# Patient Record
Sex: Female | Born: 1970 | Hispanic: No | Marital: Married | Smoking: Never smoker
Health system: Southern US, Community
[De-identification: ages and names within clinical notes are randomized; demographics above are authoritative.]

## PROBLEM LIST (undated history)

## (undated) ENCOUNTER — Emergency Department: Admission: EM | Payer: BC Managed Care – PPO

## (undated) DIAGNOSIS — E079 Disorder of thyroid, unspecified: Secondary | ICD-10-CM

## (undated) HISTORY — DX: Disorder of thyroid, unspecified: E07.9

---

## 2005-05-24 ENCOUNTER — Other Ambulatory Visit: Admission: RE | Admit: 2005-05-24 | Discharge: 2005-05-24 | Payer: Self-pay | Admitting: Gynecology

## 2006-05-02 ENCOUNTER — Encounter: Admission: RE | Admit: 2006-05-02 | Discharge: 2006-05-02 | Payer: Self-pay | Admitting: Family Medicine

## 2006-07-03 ENCOUNTER — Other Ambulatory Visit: Admission: RE | Admit: 2006-07-03 | Discharge: 2006-07-03 | Payer: Self-pay | Admitting: Gynecology

## 2009-10-08 ENCOUNTER — Encounter: Admission: RE | Admit: 2009-10-08 | Discharge: 2009-10-08 | Payer: Self-pay | Admitting: Gynecology

## 2010-08-03 ENCOUNTER — Other Ambulatory Visit: Payer: Self-pay | Admitting: Gynecology

## 2010-08-03 DIAGNOSIS — Z1231 Encounter for screening mammogram for malignant neoplasm of breast: Secondary | ICD-10-CM

## 2010-10-25 ENCOUNTER — Ambulatory Visit
Admission: RE | Admit: 2010-10-25 | Discharge: 2010-10-25 | Disposition: A | Discharge status: Home / Self Care | Payer: BC Managed Care – PPO | Source: Ambulatory Visit | Attending: Gynecology | Admitting: Gynecology

## 2010-10-25 DIAGNOSIS — Z1231 Encounter for screening mammogram for malignant neoplasm of breast: Secondary | ICD-10-CM

## 2010-10-27 ENCOUNTER — Other Ambulatory Visit: Payer: Self-pay | Admitting: Gynecology

## 2010-10-27 DIAGNOSIS — R928 Other abnormal and inconclusive findings on diagnostic imaging of breast: Secondary | ICD-10-CM

## 2010-11-02 ENCOUNTER — Ambulatory Visit
Admission: RE | Admit: 2010-11-02 | Discharge: 2010-11-02 | Disposition: A | Discharge status: Home / Self Care | Payer: BC Managed Care – PPO | Source: Ambulatory Visit | Attending: Gynecology | Admitting: Gynecology

## 2010-11-02 DIAGNOSIS — R928 Other abnormal and inconclusive findings on diagnostic imaging of breast: Secondary | ICD-10-CM

## 2011-10-28 ENCOUNTER — Other Ambulatory Visit: Payer: Self-pay | Admitting: Gynecology

## 2011-10-28 DIAGNOSIS — Z1231 Encounter for screening mammogram for malignant neoplasm of breast: Secondary | ICD-10-CM

## 2011-11-11 ENCOUNTER — Ambulatory Visit
Admission: RE | Admit: 2011-11-11 | Discharge: 2011-11-11 | Disposition: A | Discharge status: Home / Self Care | Payer: BC Managed Care – PPO | Source: Ambulatory Visit | Attending: Gynecology | Admitting: Gynecology

## 2011-11-11 DIAGNOSIS — Z1231 Encounter for screening mammogram for malignant neoplasm of breast: Secondary | ICD-10-CM

## 2011-11-17 ENCOUNTER — Other Ambulatory Visit: Payer: Self-pay | Admitting: Gynecology

## 2011-11-17 DIAGNOSIS — R928 Other abnormal and inconclusive findings on diagnostic imaging of breast: Secondary | ICD-10-CM

## 2011-11-28 ENCOUNTER — Ambulatory Visit
Admission: RE | Admit: 2011-11-28 | Discharge: 2011-11-28 | Disposition: A | Discharge status: Home / Self Care | Payer: BC Managed Care – PPO | Source: Ambulatory Visit | Attending: Gynecology | Admitting: Gynecology

## 2011-11-28 DIAGNOSIS — R928 Other abnormal and inconclusive findings on diagnostic imaging of breast: Secondary | ICD-10-CM

## 2012-03-02 IMAGING — MG MM DIGITAL DIAG LTD L {BCG}
2 series · 2 of 2 positions shown · non-contrast
Comparison: With priors

CLINICAL DATA: Abnormal left screening mammogram

DIGITAL DIAGNOSTIC LEFT MAMMOGRAM WITH CAD

[L MLO]
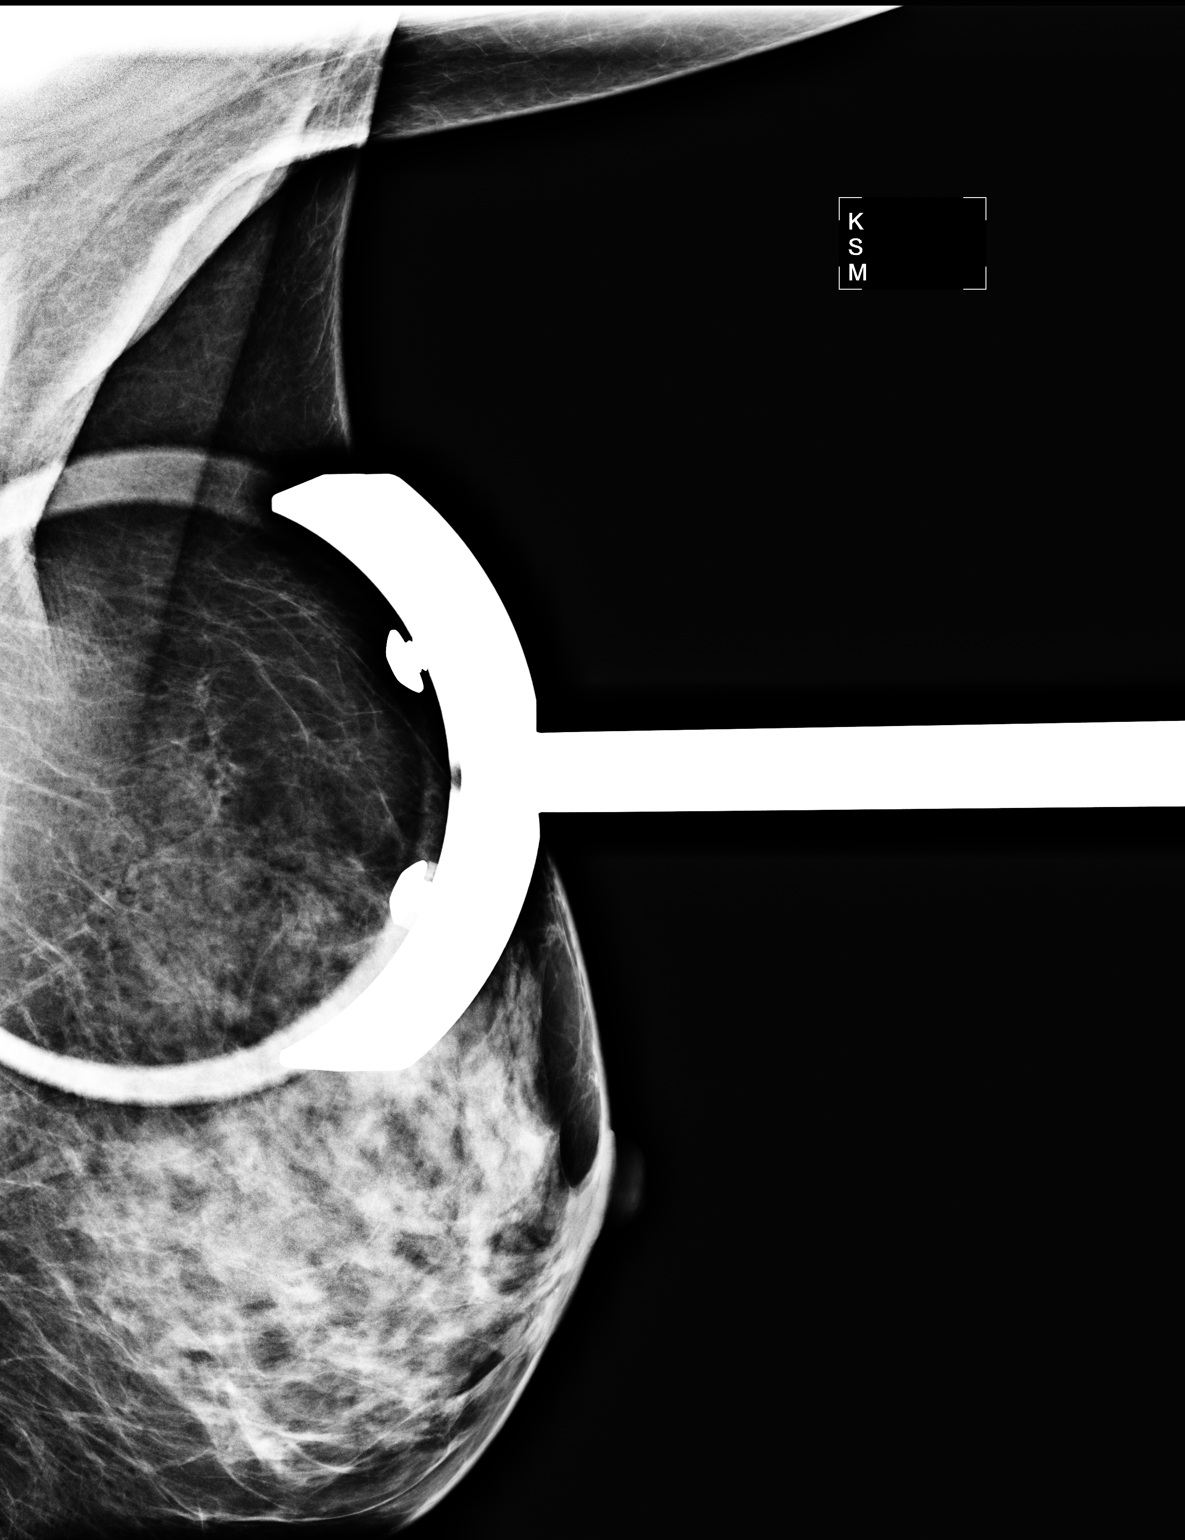

[L ML]
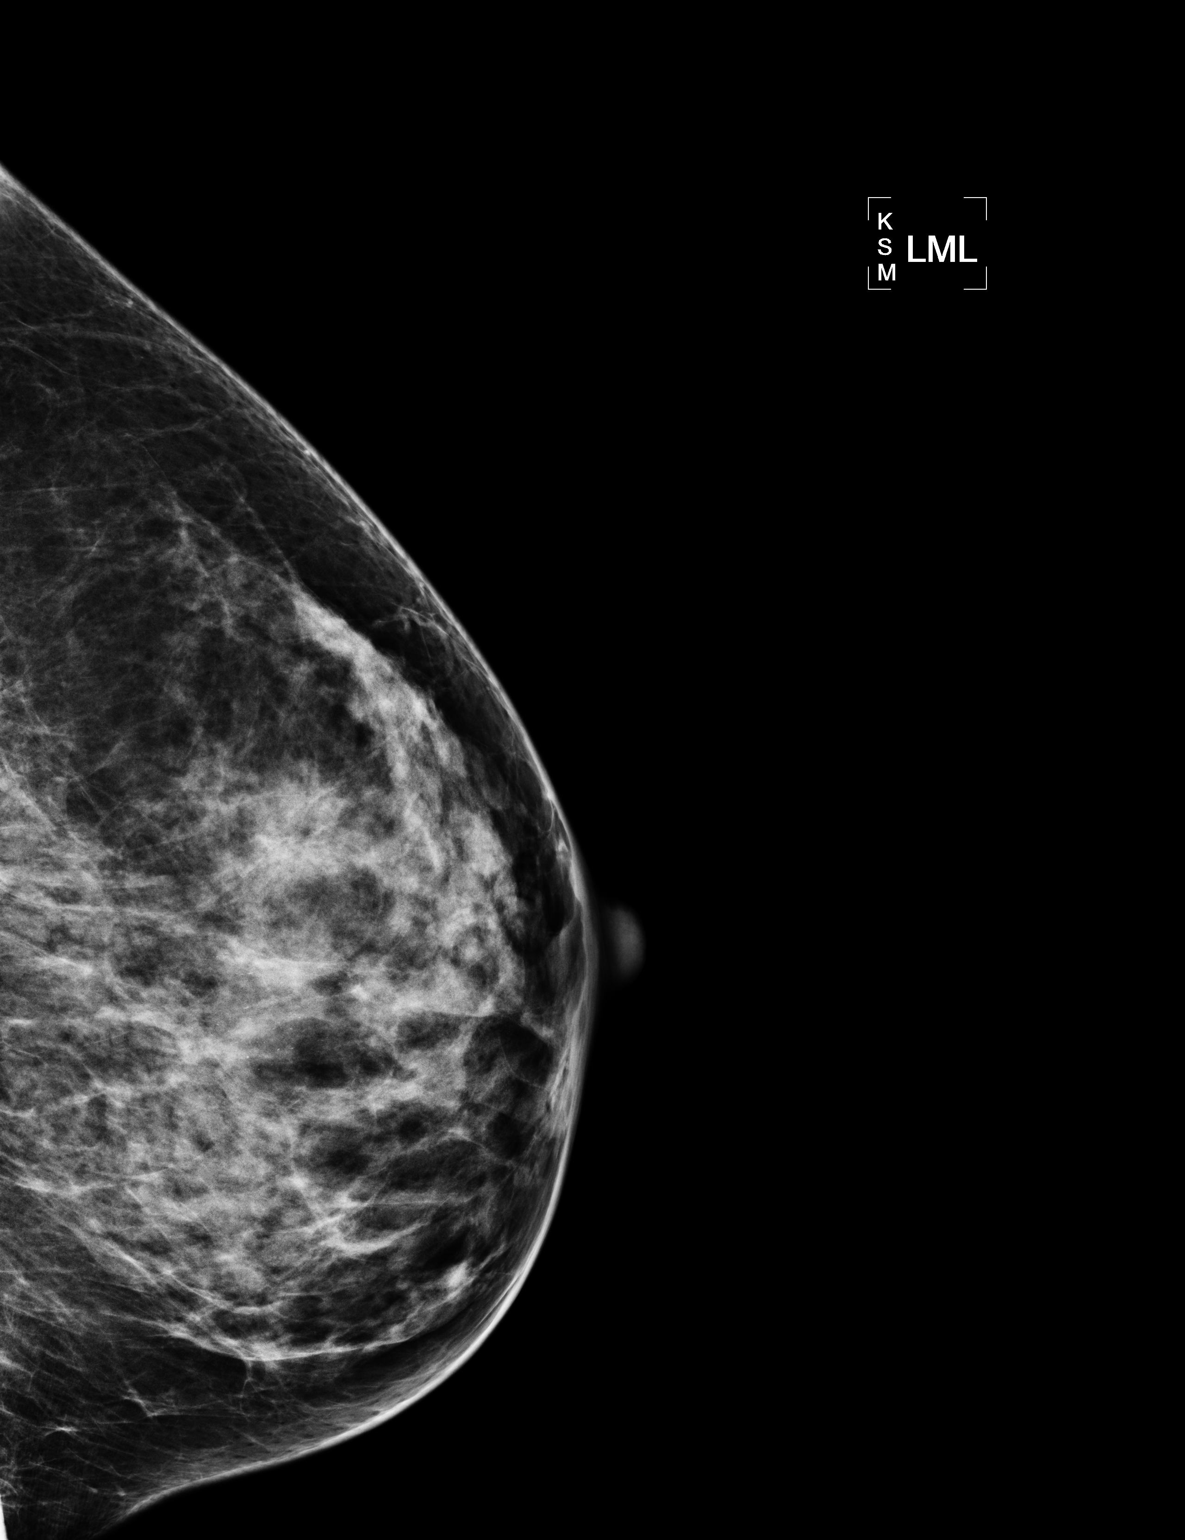

[2 of 2 positions shown; findings below may reference images not displayed]

FINDINGS: True lateral and spot compression views of the left
breast were performed.  There is no persistent mass, distortion or
malignant-type microcalcifications.
Mammographic images were processed with CAD.
IMPRESSION: No evidence of malignancy in the left breast.  Screening mammogram
in 1 year is recommended.

BI-RADS CATEGORY 1:  Negative.

## 2012-06-26 ENCOUNTER — Other Ambulatory Visit: Payer: Self-pay | Admitting: Gynecology

## 2012-06-26 DIAGNOSIS — R921 Mammographic calcification found on diagnostic imaging of breast: Secondary | ICD-10-CM

## 2012-07-06 ENCOUNTER — Ambulatory Visit
Admission: RE | Admit: 2012-07-06 | Discharge: 2012-07-06 | Disposition: A | Discharge status: Home / Self Care | Payer: BC Managed Care – PPO | Source: Ambulatory Visit | Attending: Gynecology | Admitting: Gynecology

## 2012-07-06 DIAGNOSIS — R921 Mammographic calcification found on diagnostic imaging of breast: Secondary | ICD-10-CM

## 2012-10-19 ENCOUNTER — Other Ambulatory Visit: Payer: Self-pay

## 2012-10-19 DIAGNOSIS — Z1231 Encounter for screening mammogram for malignant neoplasm of breast: Secondary | ICD-10-CM

## 2012-11-13 ENCOUNTER — Ambulatory Visit
Admission: RE | Admit: 2012-11-13 | Discharge: 2012-11-13 | Disposition: A | Discharge status: Home / Self Care | Payer: BC Managed Care – PPO | Source: Ambulatory Visit

## 2012-11-13 DIAGNOSIS — Z1231 Encounter for screening mammogram for malignant neoplasm of breast: Secondary | ICD-10-CM

## 2013-12-02 ENCOUNTER — Other Ambulatory Visit: Payer: Self-pay

## 2013-12-02 DIAGNOSIS — Z1231 Encounter for screening mammogram for malignant neoplasm of breast: Secondary | ICD-10-CM

## 2013-12-09 ENCOUNTER — Ambulatory Visit
Admission: RE | Admit: 2013-12-09 | Discharge: 2013-12-09 | Disposition: A | Discharge status: Home / Self Care | Payer: BC Managed Care – PPO | Source: Ambulatory Visit

## 2013-12-09 DIAGNOSIS — Z1231 Encounter for screening mammogram for malignant neoplasm of breast: Secondary | ICD-10-CM

## 2014-12-05 ENCOUNTER — Other Ambulatory Visit: Payer: Self-pay

## 2014-12-05 DIAGNOSIS — Z1231 Encounter for screening mammogram for malignant neoplasm of breast: Secondary | ICD-10-CM

## 2015-01-12 ENCOUNTER — Ambulatory Visit
Admission: RE | Admit: 2015-01-12 | Discharge: 2015-01-12 | Disposition: A | Discharge status: Home / Self Care | Payer: BLUE CROSS/BLUE SHIELD | Source: Ambulatory Visit

## 2015-01-12 DIAGNOSIS — Z1231 Encounter for screening mammogram for malignant neoplasm of breast: Secondary | ICD-10-CM

## 2016-02-16 ENCOUNTER — Other Ambulatory Visit: Payer: Self-pay | Admitting: Gynecology

## 2016-02-16 DIAGNOSIS — Z1231 Encounter for screening mammogram for malignant neoplasm of breast: Secondary | ICD-10-CM

## 2016-02-29 ENCOUNTER — Ambulatory Visit
Admission: RE | Admit: 2016-02-29 | Discharge: 2016-02-29 | Disposition: A | Discharge status: Home / Self Care | Payer: BLUE CROSS/BLUE SHIELD | Source: Ambulatory Visit | Attending: Gynecology | Admitting: Gynecology

## 2016-02-29 DIAGNOSIS — Z1231 Encounter for screening mammogram for malignant neoplasm of breast: Secondary | ICD-10-CM

## 2017-03-07 ENCOUNTER — Other Ambulatory Visit: Payer: Self-pay | Admitting: Gynecology

## 2017-03-07 DIAGNOSIS — Z1231 Encounter for screening mammogram for malignant neoplasm of breast: Secondary | ICD-10-CM

## 2017-03-20 ENCOUNTER — Ambulatory Visit
Admission: RE | Admit: 2017-03-20 | Discharge: 2017-03-20 | Disposition: A | Discharge status: Home / Self Care | Payer: BLUE CROSS/BLUE SHIELD | Source: Ambulatory Visit | Attending: Gynecology | Admitting: Gynecology

## 2017-03-20 DIAGNOSIS — Z1231 Encounter for screening mammogram for malignant neoplasm of breast: Secondary | ICD-10-CM

## 2017-10-13 NOTE — Progress Notes (Signed)
 Subjective:    Patient ID: Kathryn Sullivan is a 47 y.o. female.  YEP:[id number] STATING SHE FOUND A TICK ON HER SCALP 2 WKS AGO. HAS A SMALL AREA OF ITCHING. NOW NOTED SOME SWOLLEN LYMPH NODES POSTERIOR CHAIN OF NECK LEFT SIDE. NO FEVER. NO RASH. NO TX PTA.   Past Medical History:  has no past medical history on file. Past Surgical History:  has a past surgical history that includes Cesarean section. Family History: family history is not on file. Social History:  reports that she has never smoked. She has never used smokeless tobacco. Her alcohol and drug histories are not on file. Current Medications: has a current medication list which includes the following prescription(s): levothyroxine, doxycycline, and levothyroxine. Allergies: is allergic to fluconazole.  This a new patient is here today for: office visit.  ROS    Objective:  Physical Exam  Constitutional: She appears well-developed and well-nourished. No distress.  Neck:  POSTERIOR CHAIN NEAR HAIRLINE ON LEFT SIDE OF NECK  Cardiovascular: Normal rate and regular rhythm.  Pulmonary/Chest: Effort normal. No respiratory distress.  Lymphadenopathy:    She has cervical adenopathy.  Skin:  SMALL RED AREA LEFT PARITAL AREA OF SCALP  Nursing note and vitals reviewed.     Assessment:    1. Reactive cervical lymphadenopathy   - doxycycline (VIBRA-TABS) 100 MG tablet; Take 1 tablet (100 mg total) by mouth 2 (two) times daily  Dispense: 20 tablet; Refill: 0  2. Tick bite, initial encounter   - doxycycline (VIBRA-TABS) 100 MG tablet; Take 1 tablet (100 mg total) by mouth 2 (two) times daily  Dispense: 20 tablet; Refill: 0   Plan:    Patient Instructions  MARK CALENDER WITH DATE OF TICK REMOVAL MAY TAKE 2 WKS FOR NODE TO TOTALLY RESOLVE.  FOLLOW UP WITH YOUR PCP AS NEEDED.

## 2018-02-19 LAB — HM PAP SMEAR: HPV, high-risk: NEGATIVE

## 2018-02-23 ENCOUNTER — Other Ambulatory Visit: Payer: Self-pay | Admitting: Gynecology

## 2018-02-23 DIAGNOSIS — Z1231 Encounter for screening mammogram for malignant neoplasm of breast: Secondary | ICD-10-CM

## 2018-03-26 ENCOUNTER — Ambulatory Visit
Admission: RE | Admit: 2018-03-26 | Discharge: 2018-03-26 | Disposition: A | Discharge status: Home / Self Care | Payer: BLUE CROSS/BLUE SHIELD | Source: Ambulatory Visit | Attending: Gynecology | Admitting: Gynecology

## 2018-03-26 DIAGNOSIS — Z1231 Encounter for screening mammogram for malignant neoplasm of breast: Secondary | ICD-10-CM

## 2019-03-04 ENCOUNTER — Other Ambulatory Visit: Payer: Self-pay | Admitting: Gynecology

## 2019-03-04 DIAGNOSIS — Z1231 Encounter for screening mammogram for malignant neoplasm of breast: Secondary | ICD-10-CM

## 2019-04-17 ENCOUNTER — Other Ambulatory Visit: Payer: Self-pay

## 2019-04-17 ENCOUNTER — Ambulatory Visit
Admission: RE | Admit: 2019-04-17 | Discharge: 2019-04-17 | Disposition: A | Discharge status: Home / Self Care | Payer: BC Managed Care – PPO | Source: Ambulatory Visit | Attending: Gynecology | Admitting: Gynecology

## 2019-04-17 DIAGNOSIS — Z1231 Encounter for screening mammogram for malignant neoplasm of breast: Secondary | ICD-10-CM

## 2019-04-30 ENCOUNTER — Other Ambulatory Visit: Payer: Self-pay

## 2019-04-30 DIAGNOSIS — Z20822 Contact with and (suspected) exposure to covid-19: Secondary | ICD-10-CM

## 2019-05-02 LAB — NOVEL CORONAVIRUS, NAA: SARS-CoV-2, NAA: NOT DETECTED

## 2020-03-25 ENCOUNTER — Other Ambulatory Visit: Payer: Self-pay | Admitting: Gynecology

## 2020-03-25 DIAGNOSIS — Z1231 Encounter for screening mammogram for malignant neoplasm of breast: Secondary | ICD-10-CM

## 2020-04-24 ENCOUNTER — Ambulatory Visit
Admission: RE | Admit: 2020-04-24 | Discharge: 2020-04-24 | Disposition: A | Discharge status: Home / Self Care | Payer: BC Managed Care – PPO | Source: Ambulatory Visit | Attending: Gynecology | Admitting: Gynecology

## 2020-04-24 ENCOUNTER — Other Ambulatory Visit: Payer: Self-pay

## 2020-04-24 DIAGNOSIS — Z1231 Encounter for screening mammogram for malignant neoplasm of breast: Secondary | ICD-10-CM

## 2020-04-28 ENCOUNTER — Other Ambulatory Visit: Payer: Self-pay | Admitting: Gynecology

## 2020-04-28 DIAGNOSIS — N63 Unspecified lump in unspecified breast: Secondary | ICD-10-CM

## 2020-05-15 ENCOUNTER — Ambulatory Visit
Admission: RE | Admit: 2020-05-15 | Discharge: 2020-05-15 | Disposition: A | Discharge status: Home / Self Care | Payer: BC Managed Care – PPO | Source: Ambulatory Visit | Attending: Gynecology | Admitting: Gynecology

## 2020-05-15 ENCOUNTER — Other Ambulatory Visit: Payer: Self-pay

## 2020-05-15 DIAGNOSIS — N63 Unspecified lump in unspecified breast: Secondary | ICD-10-CM

## 2020-10-28 ENCOUNTER — Other Ambulatory Visit: Payer: Self-pay

## 2020-10-28 ENCOUNTER — Ambulatory Visit (INDEPENDENT_AMBULATORY_CARE_PROVIDER_SITE_OTHER): Payer: BC Managed Care – PPO | Admitting: Otolaryngology

## 2020-10-28 VITALS — Temp 97.7°F

## 2020-10-28 DIAGNOSIS — H6981 Other specified disorders of Eustachian tube, right ear: Secondary | ICD-10-CM

## 2020-10-28 DIAGNOSIS — J310 Chronic rhinitis: Secondary | ICD-10-CM | POA: Diagnosis not present

## 2020-10-28 NOTE — Progress Notes (Addendum)
HPI: Kathryn Sullivan is a 50 y.o. female who presents is referred by her employer Dr. Leticia Penna for evaluation of foreign body in the right ear.  On discussion with the patient she is apparently has had longstanding eustachian tube problems on the right side.  She states that it gets clogged up and then then stops.  She has never had a hearing test but the hearing comes and goes.  She has been treated with antibiotics as well as steroids by her medical doctor.  She is presently taking Flonase twice a day.  She states that she has more congestion on the right side of her sinuses and right ear..  No past medical history on file. No past surgical history on file. Social History   Socioeconomic History  . Marital status: Married    Spouse name: Not on file  . Number of children: Not on file  . Years of education: Not on file  . Highest education level: Not on file  Occupational History  . Not on file  Tobacco Use  . Smoking status: Not on file  . Smokeless tobacco: Not on file  Substance and Sexual Activity  . Alcohol use: Not on file  . Drug use: Not on file  . Sexual activity: Not on file  Other Topics Concern  . Not on file  Social History Narrative  . Not on file   Social Determinants of Health   Financial Resource Strain: Not on file  Food Insecurity: Not on file  Transportation Needs: Not on file  Physical Activity: Not on file  Stress: Not on file  Social Connections: Not on file   Family History  Adopted: Yes   No Known Allergies Prior to Admission medications   Not on File     Positive ROS: Otherwise negative  All other systems have been reviewed and were otherwise negative with the exception of those mentioned in the HPI and as above.  Physical Exam: Constitutional: Alert, well-appearing, no acute distress Ears: External ears without lesions or tenderness.  Left ear canal and left TM are normal.  Right ear canal reveals a small piece of wax close to the TM but  this is really not obstructing was removed in the office.  The TM on the right side was clear with reasonable mobility on pneumatic otoscopy.  She objectively on tuning fork testing AC was greater than BC on the right side however she felt like she her little bit better on the left side compared to the right.  Weber was midline. Nasal: External nose without lesions. Septum slightly deviated to the right.  Mild rhinitis..  On nasal endoscopy the left nasal cavity was clear.  The right nasal cavity revealed a little bit more mucus.  Especially posteriorly around the eustachian tube region.  There was no mucopurulent discharge noted in both middle meatus regions were clear.  The nasopharynx was clear with nothing obstructing the eustachian tube opening except for small amount of mucus. Oral: Lips and gums without lesions. Tongue and palate mucosa without lesions. Posterior oropharynx clear. Neck: No palpable adenopathy or masses Respiratory: Breathing comfortably  Skin: No facial/neck lesions or rash noted.  Procedures  Assessment: Chronic rhinitis with right eustachian tube dysfunction which has been going on for years.  Plan: Recommended use of saline irrigation on a regular basis to help clear the mucus from the nasal cavity and suggested using AYR or Xlear brand. I also prescribed Nasacort 2 sprays each nostril at night or  at least on the right side on a regular basis. If symptoms have not significantly improved within a month she will call us back to schedule audiologic testing.   Narda Bonds, MD   CC:

## 2021-03-19 ENCOUNTER — Other Ambulatory Visit: Payer: Self-pay | Admitting: Gynecology

## 2021-03-19 DIAGNOSIS — Z1231 Encounter for screening mammogram for malignant neoplasm of breast: Secondary | ICD-10-CM

## 2021-05-17 ENCOUNTER — Ambulatory Visit
Admission: RE | Admit: 2021-05-17 | Discharge: 2021-05-17 | Disposition: A | Discharge status: Home / Self Care | Payer: BC Managed Care – PPO | Source: Ambulatory Visit | Attending: Gynecology | Admitting: Gynecology

## 2021-05-17 ENCOUNTER — Other Ambulatory Visit: Payer: Self-pay

## 2021-05-17 DIAGNOSIS — Z1231 Encounter for screening mammogram for malignant neoplasm of breast: Secondary | ICD-10-CM

## 2022-03-28 ENCOUNTER — Other Ambulatory Visit: Payer: Self-pay | Admitting: Gynecology

## 2022-03-28 DIAGNOSIS — Z1231 Encounter for screening mammogram for malignant neoplasm of breast: Secondary | ICD-10-CM

## 2022-05-20 ENCOUNTER — Ambulatory Visit
Admission: RE | Admit: 2022-05-20 | Discharge: 2022-05-20 | Disposition: A | Discharge status: Home / Self Care | Payer: BC Managed Care – PPO | Source: Ambulatory Visit | Attending: Gynecology | Admitting: Gynecology

## 2022-05-20 DIAGNOSIS — Z1231 Encounter for screening mammogram for malignant neoplasm of breast: Secondary | ICD-10-CM

## 2022-06-08 DIAGNOSIS — Z130 Encounter for screening for diseases of the blood and blood-forming organs and certain disorders involving the immune mechanism: Secondary | ICD-10-CM | POA: Diagnosis not present

## 2022-06-08 DIAGNOSIS — Z01419 Encounter for gynecological examination (general) (routine) without abnormal findings: Secondary | ICD-10-CM | POA: Diagnosis not present

## 2022-06-08 DIAGNOSIS — N951 Menopausal and female climacteric states: Secondary | ICD-10-CM | POA: Diagnosis not present

## 2022-06-08 DIAGNOSIS — Z1389 Encounter for screening for other disorder: Secondary | ICD-10-CM | POA: Diagnosis not present

## 2022-09-12 DIAGNOSIS — W540XXA Bitten by dog, initial encounter: Secondary | ICD-10-CM | POA: Diagnosis not present

## 2022-09-12 DIAGNOSIS — S51851A Open bite of right forearm, initial encounter: Secondary | ICD-10-CM | POA: Diagnosis not present

## 2022-09-15 IMAGING — MG MM DIGITAL SCREENING BILAT W/ TOMO AND CAD
8 series · 9 of 24 positions shown · non-contrast
Comparison: Previous exam(s).

CLINICAL DATA: Screening.

EXAM:
DIGITAL SCREENING BILATERAL MAMMOGRAM WITH TOMOSYNTHESIS AND CAD
TECHNIQUE: Bilateral screening digital craniocaudal and mediolateral oblique
mammograms were obtained. Bilateral screening digital breast
tomosynthesis was performed. The images were evaluated with
computer-aided detection.

[L CC synth-2D]
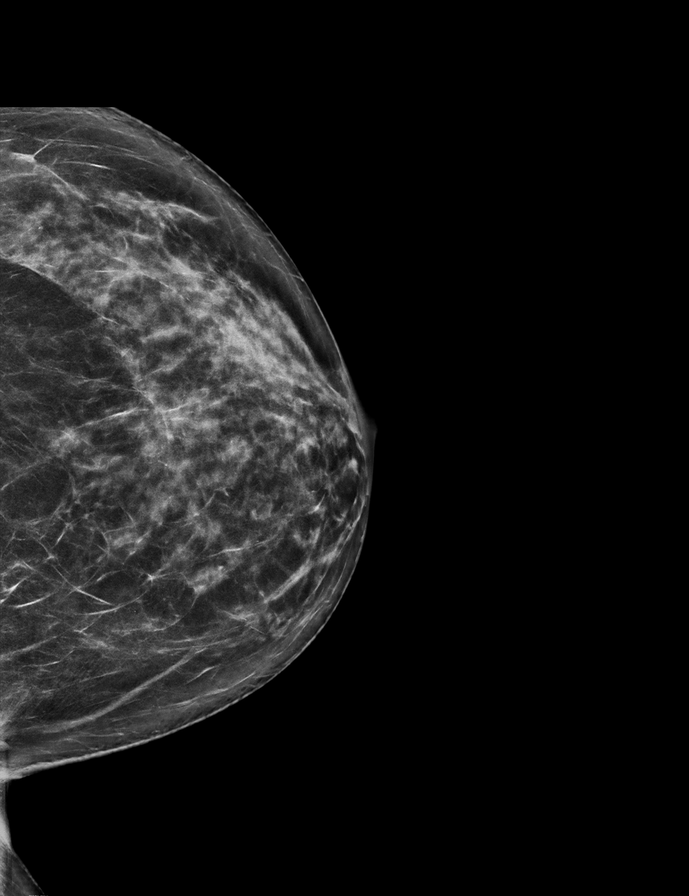

[L MLO synth-2D]
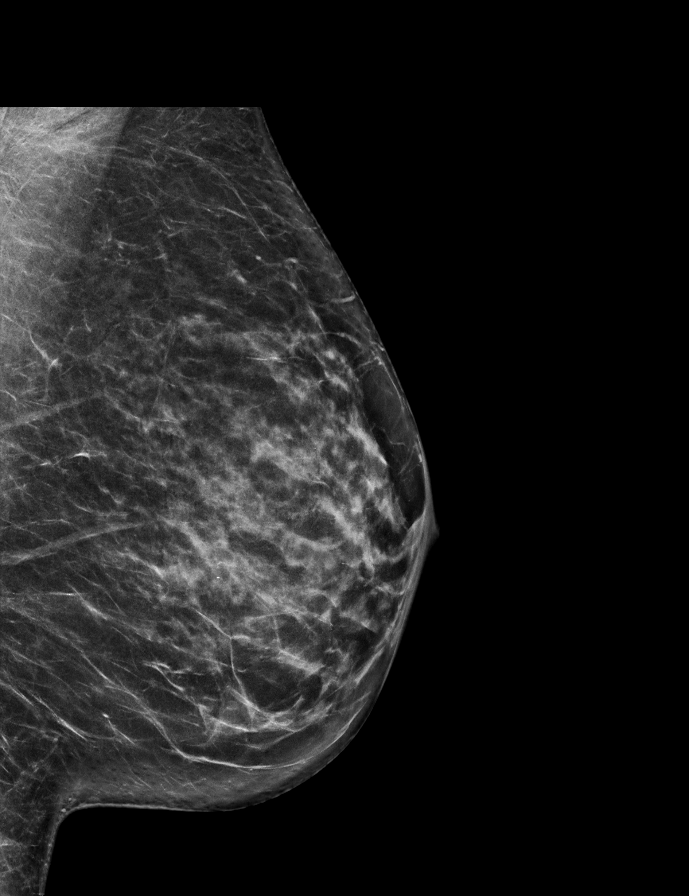

[R CC synth-2D]
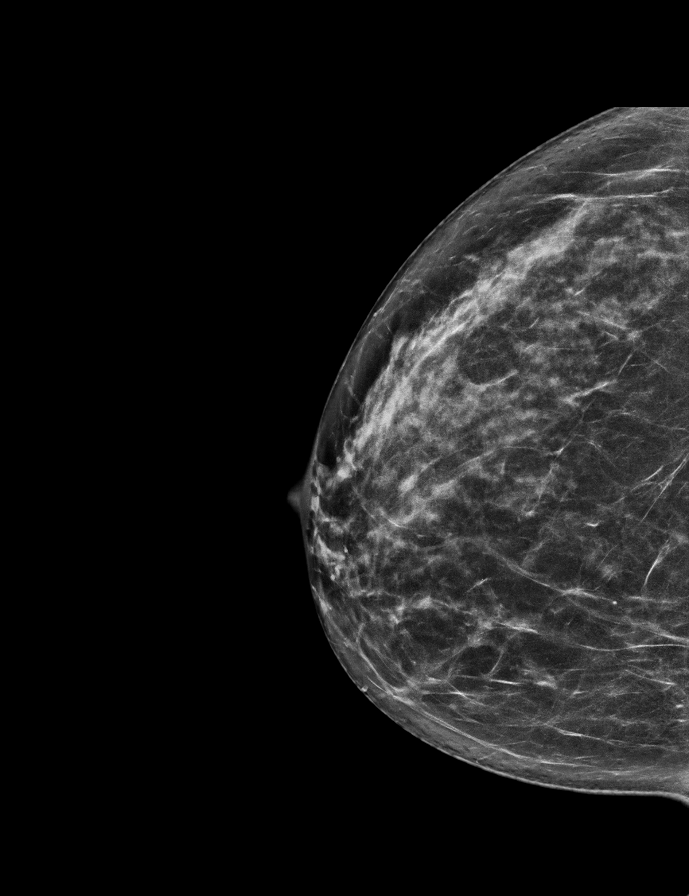

[R MLO synth-2D]
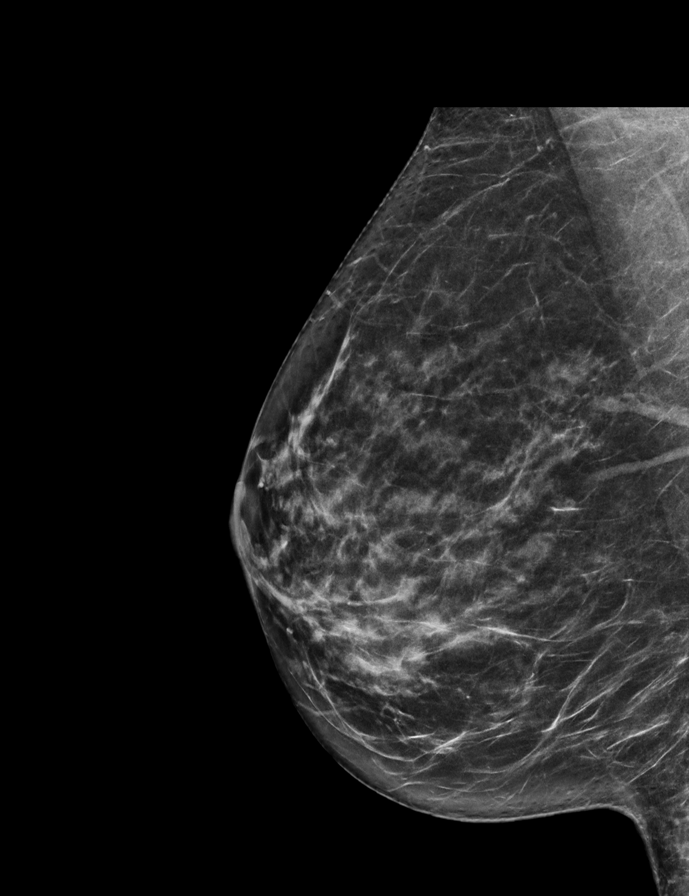

[R MLO tomo · 2 of 66 frames shown]
[frame 22/66]
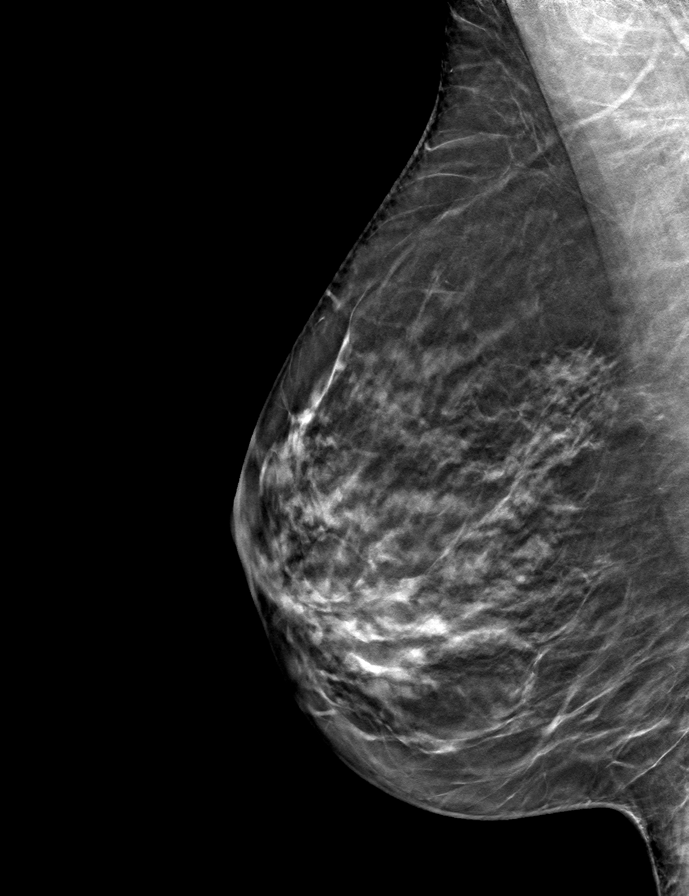
[frame 33/66]
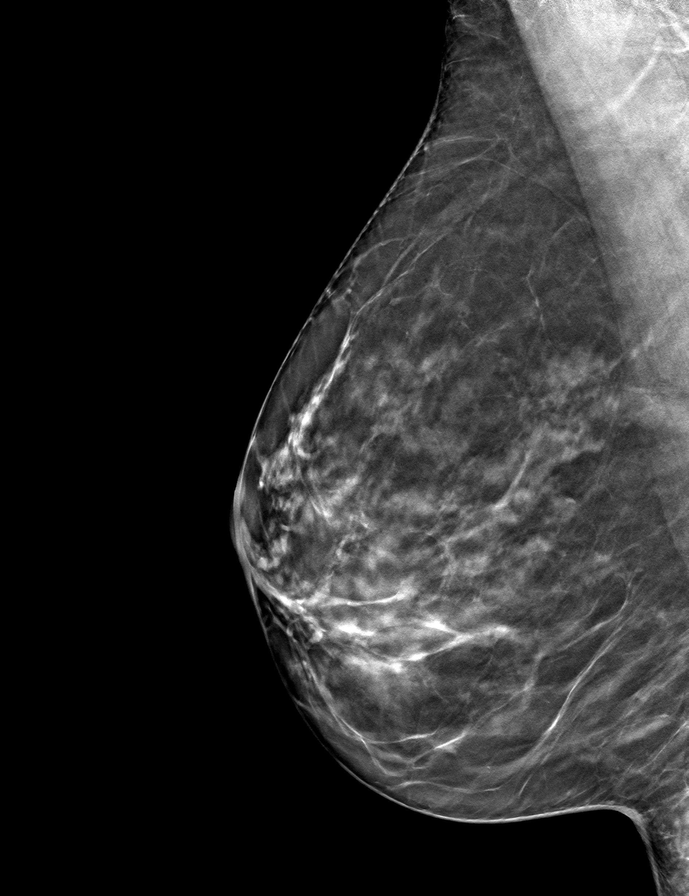

[L CC tomo · tomo slice 34/67.0]
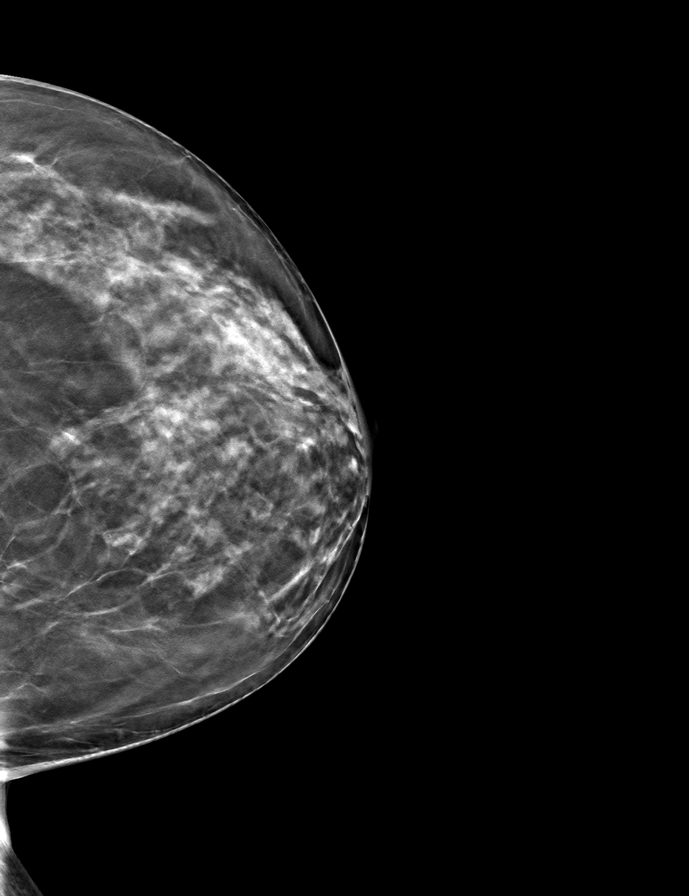

[R CC tomo · tomo slice 33/65.0]
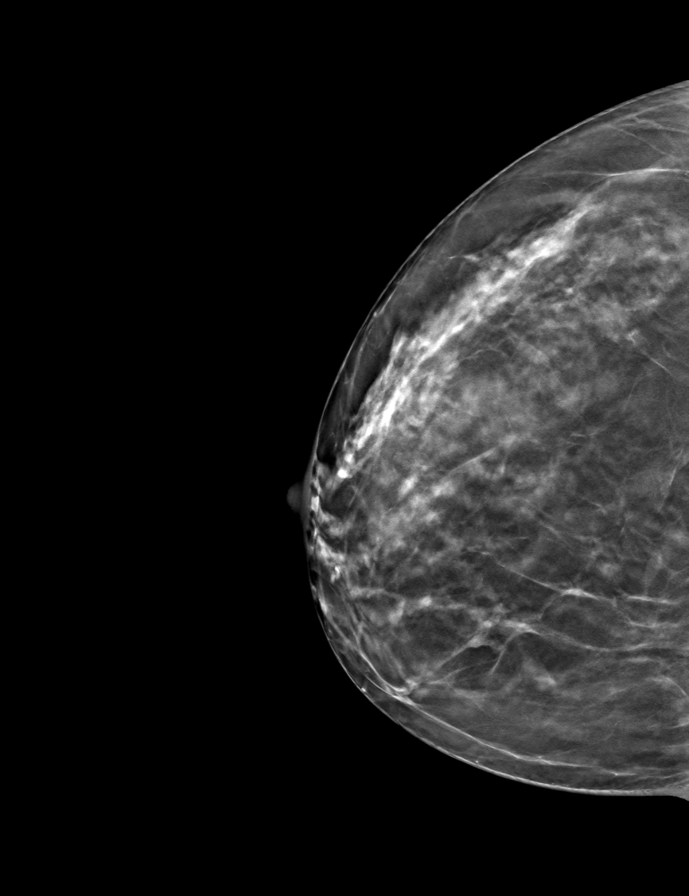

[L MLO tomo · tomo slice 35/69.0]
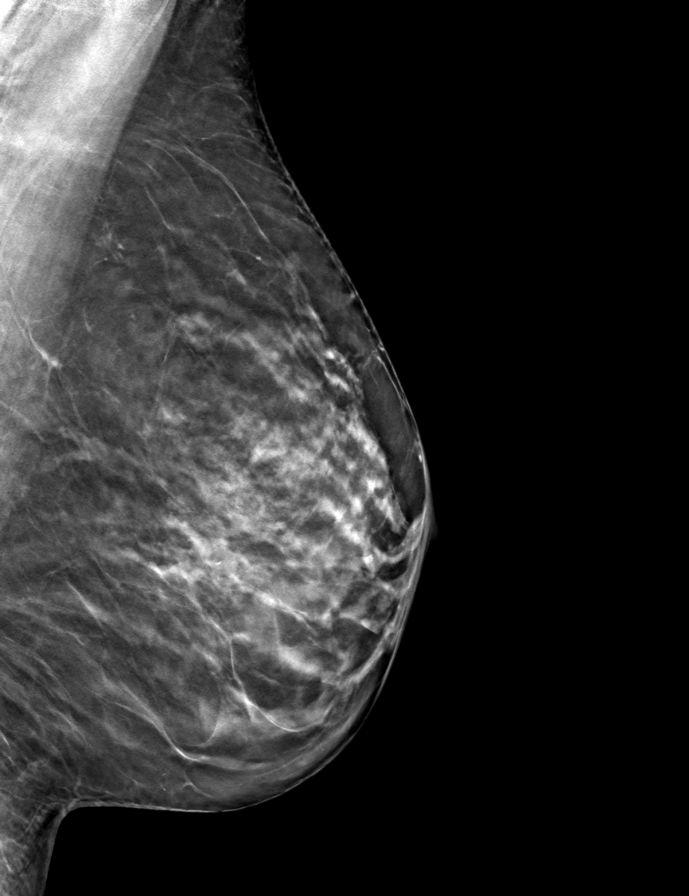

[9 of 24 positions shown; findings below may reference images not displayed]

ACR Breast Density Category c: The breast tissue is heterogeneously
dense, which may obscure small masses.
FINDINGS: There are no findings suspicious for malignancy.
IMPRESSION: No mammographic evidence of malignancy. A result letter of this
screening mammogram will be mailed directly to the patient.

RECOMMENDATION:
Screening mammogram in one year. (Code:Q3-W-BC3)

BI-RADS CATEGORY  1: Negative.

## 2023-01-27 DIAGNOSIS — E785 Hyperlipidemia, unspecified: Secondary | ICD-10-CM | POA: Diagnosis not present

## 2023-01-27 DIAGNOSIS — E039 Hypothyroidism, unspecified: Secondary | ICD-10-CM | POA: Diagnosis not present

## 2023-01-31 DIAGNOSIS — E038 Other specified hypothyroidism: Secondary | ICD-10-CM | POA: Diagnosis not present

## 2023-01-31 DIAGNOSIS — E785 Hyperlipidemia, unspecified: Secondary | ICD-10-CM | POA: Diagnosis not present

## 2023-02-22 DIAGNOSIS — Z1211 Encounter for screening for malignant neoplasm of colon: Secondary | ICD-10-CM | POA: Diagnosis not present

## 2023-02-22 DIAGNOSIS — Z1212 Encounter for screening for malignant neoplasm of rectum: Secondary | ICD-10-CM | POA: Diagnosis not present

## 2023-04-14 ENCOUNTER — Other Ambulatory Visit: Payer: Self-pay | Admitting: Gynecology

## 2023-04-14 DIAGNOSIS — Z1231 Encounter for screening mammogram for malignant neoplasm of breast: Secondary | ICD-10-CM

## 2023-05-22 ENCOUNTER — Ambulatory Visit
Admission: RE | Admit: 2023-05-22 | Discharge: 2023-05-22 | Disposition: A | Discharge status: Home / Self Care | Payer: BC Managed Care – PPO | Source: Ambulatory Visit | Attending: Gynecology | Admitting: Gynecology

## 2023-05-22 DIAGNOSIS — Z1231 Encounter for screening mammogram for malignant neoplasm of breast: Secondary | ICD-10-CM

## 2023-06-20 DIAGNOSIS — Z01419 Encounter for gynecological examination (general) (routine) without abnormal findings: Secondary | ICD-10-CM | POA: Diagnosis not present

## 2023-06-20 DIAGNOSIS — Z130 Encounter for screening for diseases of the blood and blood-forming organs and certain disorders involving the immune mechanism: Secondary | ICD-10-CM | POA: Diagnosis not present

## 2024-01-01 ENCOUNTER — Ambulatory Visit (INDEPENDENT_AMBULATORY_CARE_PROVIDER_SITE_OTHER)

## 2024-01-01 ENCOUNTER — Other Ambulatory Visit: Payer: Self-pay

## 2024-01-01 ENCOUNTER — Telehealth: Payer: Self-pay

## 2024-01-01 VITALS — BP 110/70 | HR 62 | Ht 61.5 in | Wt 110.0 lb

## 2024-01-01 DIAGNOSIS — R7309 Other abnormal glucose: Secondary | ICD-10-CM | POA: Diagnosis not present

## 2024-01-01 DIAGNOSIS — R5382 Chronic fatigue, unspecified: Secondary | ICD-10-CM | POA: Diagnosis not present

## 2024-01-01 DIAGNOSIS — E039 Hypothyroidism, unspecified: Secondary | ICD-10-CM

## 2024-01-01 DIAGNOSIS — R5383 Other fatigue: Secondary | ICD-10-CM | POA: Insufficient documentation

## 2024-01-01 DIAGNOSIS — N951 Menopausal and female climacteric states: Secondary | ICD-10-CM | POA: Insufficient documentation

## 2024-01-01 DIAGNOSIS — E538 Deficiency of other specified B group vitamins: Secondary | ICD-10-CM | POA: Diagnosis not present

## 2024-01-01 NOTE — Telephone Encounter (Signed)
 Copied from CRM [id number]. Topic: General - Other >> Jan 01, 2024 12:54 PM Delon DASEN wrote: Reason for CRM: Patient has questions about the sleep study- [phone]

## 2024-01-01 NOTE — Telephone Encounter (Signed)
 Advised patient that it looks like the order was placed. Someone should reach out to her with further information. Will contact us  if she hasn't heard anything.

## 2024-01-01 NOTE — Assessment & Plan Note (Signed)
 Labs for TSH/T4 today.  Patient clinically euthyroid other than general fatigue.

## 2024-01-01 NOTE — Assessment & Plan Note (Signed)
 Patient fatigue significant and chronic.  Query sleep quality issues.  She does snore at night.  Will put in for home sleep study.  In addition, will check iron B12 folate levels and recheck thyroid.

## 2024-01-01 NOTE — Assessment & Plan Note (Signed)
 Patient seems to be tolerating symptoms of menopause without any intervention.  We discussed possibility of low-dose transdermal estrogen or even vaginal estrogen if symptoms become bothersome.  There are many ways otherwise that are nonestrogen-based to treat symptoms if necessary.

## 2024-01-01 NOTE — Progress Notes (Unsigned)
 New Patient Visit   Physician: Weston Kallman A Nasiah Polinsky, MD  Patient: Kathryn Sullivan   DOB: 1971/03/07   53 y.o. Female  MRN: [id number] Visit Date: 01/01/2024   Chief Complaint  Patient presents with   Establish Care    Denies any concerns    Subjective  Kathryn Sullivan is a 53 y.o. female who presents today as a new patient to establish care.    HPI  Patient has a history of hypothyroidism.  She is currently on Synthroid 50 mcg daily.  Clinically euthyroid other than general complaints of fatigue.  Patient reports that she has chronic fatigue throughout the day.  She wakes feeling unrested.  She does report her snoring history.  The patient feels that sleep is disturbed at times with night wakening.  She does not want to take medication.  She overall has good sleep hygiene.  Weight seems to be stable.  Patient has healthy diet and does exercise with walking daily.  She does not take vitamin D.  Patient has hot flashes at times.  She is menopausal currently.  She feels that these are manageable.  Patient has category C breast density.  Her last mammogram was complete in December 2024.  Patient has started new job which she finds somewhat stressful.  This was started about a year ago.  She does work from home.  She works in [employer]  Patient is adopted and family history for the most part is unknown.  Patient denies drinking smoking or drug use.  Patient does not have a history of abnormal Pap smears.  Last HPV done in the last 2 years.  No abnormal Pap results other than LSIL per patient report.    Assessment & Plan   Problem List Items Addressed This Visit       Endocrine   Hypothyroidism - Primary   Labs for TSH/T4 today.  Patient clinically euthyroid other than general fatigue.      Relevant Medications   SYNTHROID 50 MCG tablet   Other Relevant Orders   CBC with Differential/Platelet   CMP14+EGFR   Hemoglobin A1c   Lipid Panel With LDL/HDL Ratio    Urinalysis, Routine w reflex microscopic   Lipoprotein A (LPA)   TSH + free T4   Iron, TIBC and Ferritin Panel   B12 and Folate Panel     Other   Fatigue   Patient fatigue significant and chronic.  Query sleep quality issues.  She does snore at night.  Will put in for home sleep study.  In addition, will check iron B12 folate levels and recheck thyroid.      Relevant Orders   CBC with Differential/Platelet   CMP14+EGFR   Hemoglobin A1c   Lipid Panel With LDL/HDL Ratio   Urinalysis, Routine w reflex microscopic   Lipoprotein A (LPA)   TSH + free T4   Iron, TIBC and Ferritin Panel   B12 and Folate Panel   Vasomotor symptoms due to menopause   Patient seems to be tolerating symptoms of menopause without any intervention.  We discussed possibility of low-dose transdermal estrogen or even vaginal estrogen if symptoms become bothersome.  There are many ways otherwise that are nonestrogen-based to treat symptoms if necessary.      Relevant Orders   CBC with Differential/Platelet   CMP14+EGFR   Hemoglobin A1c   Lipid Panel With LDL/HDL Ratio   Urinalysis, Routine w reflex microscopic   Lipoprotein A (LPA)   TSH + free  T4   Iron, TIBC and Ferritin Panel   B12 and Folate Panel   Patient to check labs today and will put in for sleep study.  Otherwise we will have her follow-up in 5 weeks.  Supplment vitamin D 1000.  Recommend weight beairng exercise.    Patient generally declines vaccines    Objective  BP 110/70 (BP Location: Left Arm, Patient Position: Sitting, Cuff Size: Normal)   Pulse 62   Ht 5' 1.5 (1.562 m)   Wt 110 lb (49.9 kg)   LMP 05/10/2020   SpO2 99%   BMI 20.45 kg/m  {Insert last BP/Wt (optional):23777}{See vitals history (optional):1}    Review of Systems  Constitutional:  Negative for chills, fever and weight loss.  Eyes:  Negative for blurred vision. h Respiratory:  Negative for cough and shortness of breath.   Cardiovascular:  Negative for chest pain  and palpitations.  Skin:  Negative for rash.  Psychiatric/Behavioral:  Negative for depression. The patient is not nervous/anxious.      Physical Exam Physical Exam Vitals reviewed.  Constitutional:      Appearance: Normal appearance. Well-developed with normal weight.  HENT:     Head: Normocephalic and atraumatic.  Normal mucous membranes, no oral lesions Eyes:     Pupils: Pupils are equal, round, and reactive to light.  Neck:     Thyroid: No thyroid mass or thyromegaly.  Cardiovascular:     Rate and Rhythm: Normal rate and regular rhythm. Normal heart sounds. Normal peripheral pulses Pulmonary:     Normal breath sounds with normal effort Abdominal:   Abdomen is soft, without tenderness or noted hepatosplenomegaly Musculoskeletal:        General: No swelling or edema  Lymphadenopathy:     Cervical: No cervical adenopathy.  Skin:    General: Skin is warm and dry without noticeable rash. Neurological:     General: No focal deficit present.  Psychiatric:        Mood and Affect: Mood, behavior and cognition normal   Past Medical History:  Diagnosis Date   Thyroid disease 2000   Hypothyroidism   Past Surgical History:  Procedure Laterality Date   CESAREAN SECTION  01/26/04   No family status information on file.   Family History  Adopted: Yes   Social History   Socioeconomic History   Marital status: Married    Spouse name: Not on file   Number of children: Not on file   Years of education: Not on file   Highest education level: Associate degree: academic program  Occupational History   Not on file  Tobacco Use   Smoking status: Never   Smokeless tobacco: Never  Vaping Use   Vaping status: Never Used  Substance and Sexual Activity   Alcohol use: Never   Drug use: Never   Sexual activity: Yes    Birth control/protection: Post-menopausal    Comment: Tubal ligation  Other Topics Concern   Not on file  Social History Narrative   Not on file   Social  Drivers of Health   Financial Resource Strain: Low Risk  (12/25/2023)   Overall Financial Resource Strain (CARDIA)    Difficulty of Paying Living Expenses: Not hard at all  Food Insecurity: No Food Insecurity (12/25/2023)   Hunger Vital Sign    Worried About Running Out of Food in the Last Year: Never true    Ran Out of Food in the Last Year: Never true  Transportation Needs: No Transportation Needs (12/25/2023)  PRAPARE - Administrator, Civil Service (Medical): No    Lack of Transportation (Non-Medical): No  Physical Activity: Sufficiently Active (12/25/2023)   Exercise Vital Sign    Days of Exercise per Week: 4 days    Minutes of Exercise per Session: 40 min  Stress: No Stress Concern Present (12/25/2023)   [employer] of Occupational Health - Occupational Stress Questionnaire    Feeling of Stress: Not at all  Social Connections: Socially Integrated (12/25/2023)   Social Connection and Isolation Panel    Frequency of Communication with Friends and Family: More than three times a week    Frequency of Social Gatherings with Friends and Family: More than three times a week    Attends Religious Services: More than 4 times per year    Active Member of [employer] or Organizations: Yes    Attends Engineer, structural: More than 4 times per year    Marital Status: Married   Outpatient Medications Prior to Visit  Medication Sig   SYNTHROID 50 MCG tablet Take 50 mcg by mouth daily before breakfast. Additional 1/2 tab on Wednesday   No facility-administered medications prior to visit.   Allergies  Allergen Reactions   Fluconazole Hives     There is no immunization history on file for this patient.  Health Maintenance  Topic Date Due   HIV Screening  Never done   Hepatitis C Screening  Never done   DTaP/Tdap/Td (1 - Tdap) Never done   Hepatitis B Vaccines (1 of 3 - 19+ 3-dose series) Never done   Cervical Cancer Screening (HPV/Pap Cotest)  Never done    Colonoscopy  Never done   Zoster Vaccines- Shingrix (1 of 2) Never done   COVID-19 Vaccine (1 - 2024-25 season) Never done   INFLUENZA VACCINE  01/12/2024   MAMMOGRAM  05/21/2025   HPV VACCINES  Aged Out   Meningococcal B Vaccine  Aged Out    Patient Care Team: Everlene Parris LABOR, MD as PCP - General (Family Medicine)  Depression Screen     No data to display           Parris LABOR Everlene, MD  [facility] [phone] (phone) [phone] (fax)  [facility]

## 2024-01-02 ENCOUNTER — Ambulatory Visit: Payer: Self-pay

## 2024-01-02 DIAGNOSIS — E785 Hyperlipidemia, unspecified: Secondary | ICD-10-CM | POA: Insufficient documentation

## 2024-01-02 DIAGNOSIS — E782 Mixed hyperlipidemia: Secondary | ICD-10-CM

## 2024-01-05 LAB — IRON,TIBC AND FERRITIN PANEL
%SAT: 19 % (ref 16–45)
Ferritin: 36 ng/mL (ref 16–232)
Iron: 70 ug/dL (ref 45–160)
TIBC: 363 ug/dL (ref 250–450)

## 2024-01-05 LAB — B12 AND FOLATE PANEL
Folate: 14.7 ng/mL
Vitamin B-12: 371 pg/mL (ref 200–1100)

## 2024-01-05 LAB — CBC WITH DIFFERENTIAL/PLATELET
Absolute Lymphocytes: 1835 {cells}/uL (ref 850–3900)
Absolute Monocytes: 435 {cells}/uL (ref 200–950)
Basophils Absolute: 41 {cells}/uL (ref 0–200)
Basophils Relative: 0.6 %
Eosinophils Absolute: 90 {cells}/uL (ref 15–500)
Eosinophils Relative: 1.3 %
HCT: 46.6 % — ABNORMAL HIGH (ref 35.0–45.0)
Hemoglobin: 14.7 g/dL (ref 11.7–15.5)
MCH: 28.5 pg (ref 27.0–33.0)
MCHC: 31.5 g/dL — ABNORMAL LOW (ref 32.0–36.0)
MCV: 90.5 fL (ref 80.0–100.0)
MPV: 9.8 fL (ref 7.5–12.5)
Monocytes Relative: 6.3 %
Neutro Abs: 4499 {cells}/uL (ref 1500–7800)
Neutrophils Relative %: 65.2 %
Platelets: 312 Thousand/uL (ref 140–400)
RBC: 5.15 Million/uL — ABNORMAL HIGH (ref 3.80–5.10)
RDW: 13.2 % (ref 11.0–15.0)
Total Lymphocyte: 26.6 %
WBC: 6.9 Thousand/uL (ref 3.8–10.8)

## 2024-01-05 LAB — COMPREHENSIVE METABOLIC PANEL WITH GFR
AG Ratio: 1.8 (calc) (ref 1.0–2.5)
ALT: 12 U/L (ref 6–29)
AST: 16 U/L (ref 10–35)
Albumin: 4.7 g/dL (ref 3.6–5.1)
Alkaline phosphatase (APISO): 52 U/L (ref 37–153)
BUN: 19 mg/dL (ref 7–25)
CO2: 28 mmol/L (ref 20–32)
Calcium: 9.8 mg/dL (ref 8.6–10.4)
Chloride: 105 mmol/L (ref 98–110)
Creat: 0.87 mg/dL (ref 0.50–1.03)
Globulin: 2.6 g/dL (ref 1.9–3.7)
Glucose, Bld: 85 mg/dL (ref 65–99)
Potassium: 4.5 mmol/L (ref 3.5–5.3)
Sodium: 142 mmol/L (ref 135–146)
Total Bilirubin: 0.4 mg/dL (ref 0.2–1.2)
Total Protein: 7.3 g/dL (ref 6.1–8.1)
eGFR: 79 mL/min/1.73m2 (ref >=60)

## 2024-01-05 LAB — LIPID PANEL
Cholesterol: 233 mg/dL — ABNORMAL HIGH (ref <200)
HDL: 78 mg/dL (ref >=50)
LDL Cholesterol (Calc): 136 mg/dL — ABNORMAL HIGH
Non-HDL Cholesterol (Calc): 155 mg/dL — ABNORMAL HIGH (ref <130)
Total CHOL/HDL Ratio: 3 (calc) (ref <5.0)
Triglycerides: 91 mg/dL (ref <150)

## 2024-01-05 LAB — URINALYSIS, ROUTINE W REFLEX MICROSCOPIC
Bilirubin Urine: NEGATIVE
Glucose, UA: NEGATIVE
Hgb urine dipstick: NEGATIVE
Ketones, ur: NEGATIVE
Leukocytes,Ua: NEGATIVE
Nitrite: NEGATIVE
Protein, ur: NEGATIVE
Specific Gravity, Urine: 1.022 (ref 1.001–1.035)
pH: 6.5 (ref 5.0–8.0)

## 2024-01-05 LAB — TSH+FREE T4: TSH W/REFLEX TO FT4: 2.23 m[IU]/L

## 2024-01-05 LAB — HEMOGLOBIN A1C
Hgb A1c MFr Bld: 5.6 % (ref <5.7)
Mean Plasma Glucose: 114 mg/dL
eAG (mmol/L): 6.3 mmol/L

## 2024-01-05 LAB — LIPOPROTEIN A (LPA): Lipoprotein (a): 176 nmol/L — ABNORMAL HIGH (ref <75)

## 2024-01-22 ENCOUNTER — Telehealth: Payer: Self-pay

## 2024-01-22 NOTE — Telephone Encounter (Signed)
 Copied from CRM #[id number]. Topic: Clinical - Request for Lab/Test Order >> Jan 22, 2024 11:59 AM Logan F wrote: Reason for CRM: Pt is schedule to come in on 8/29. It is for a follow up from her sleep study and labs. She says she has not had the sleep study done yet and she think it is no point in coming in if she has not had it done. She says she was supposed to been called for the sleep study but hasn't been called yet. She will be going out of town soon and does not want to do the sleep study while she is away. Please advise

## 2024-02-05 ENCOUNTER — Other Ambulatory Visit: Payer: Self-pay

## 2024-02-05 ENCOUNTER — Telehealth: Payer: Self-pay

## 2024-02-05 DIAGNOSIS — R5383 Other fatigue: Secondary | ICD-10-CM

## 2024-02-05 NOTE — Telephone Encounter (Signed)
 Copied from CRM [id number]. Topic: Referral - Status >> Feb 05, 2024  8:45 AM Viola F wrote: Reason for CRM: Patient called to follow up on status of sleep study, there is no referral in, says she called twice about this. Please call her at [phone] (M)

## 2024-02-05 NOTE — Telephone Encounter (Signed)
 Patient advised and verbalized understanding

## 2024-02-09 ENCOUNTER — Ambulatory Visit

## 2024-02-21 ENCOUNTER — Telehealth: Payer: Self-pay

## 2024-02-21 NOTE — Telephone Encounter (Signed)
 Copied from CRM [id number]. Topic: Clinical - Request for Lab/Test Order >> Feb 21, 2024 11:21 AM Myrick T wrote: Reason for CRM: patient said provider said she needed a sleep study at her new patient appt but she has called 3x and left messages with no return call. Patient said this has not been a great experience and would like for someone to call her back to give her info on the sleep study.

## 2024-02-22 NOTE — Telephone Encounter (Signed)
 Patient advised.

## 2024-02-28 ENCOUNTER — Other Ambulatory Visit: Payer: Self-pay

## 2024-02-28 DIAGNOSIS — E039 Hypothyroidism, unspecified: Secondary | ICD-10-CM

## 2024-02-28 DIAGNOSIS — R5382 Chronic fatigue, unspecified: Secondary | ICD-10-CM

## 2024-02-28 DIAGNOSIS — N951 Menopausal and female climacteric states: Secondary | ICD-10-CM

## 2024-02-28 NOTE — Telephone Encounter (Signed)
 Copied from CRM #[id number]. Topic: Clinical - Medication Refill >> Feb 28, 2024  8:46 AM Edsel HERO wrote: Medication: SYNTHROID  50 MCG tablet  Has the patient contacted their pharmacy? No  This is the patient's preferred pharmacy:  [id number] - [place], [place] - [address] [place] [address] Phone: [phone] Fax: [phone]  Is this the correct pharmacy for this prescription? Yes  Has the prescription been filled recently? Yes  Is the patient out of the medication? Yes  Has the patient been seen for an appointment in the last year OR does the patient have an upcoming appointment? Yes  Can we respond through MyChart? Yes

## 2024-02-29 MED ORDER — SYNTHROID 50 MCG PO TABS: 50.0000 ug | ORAL_TABLET | Freq: Every day | ORAL | 3 refills | Status: AC

## 2024-02-29 NOTE — Telephone Encounter (Signed)
 Requested medications are due for refill today.  unsure  Requested medications are on the active medications list.  yes  Last refill. 01/01/2024  Future visit scheduled.   no  Notes to clinic.  Medication is historical.    Requested Prescriptions  Pending Prescriptions Disp Refills   SYNTHROID  50 MCG tablet      Sig: Take 1 tablet (50 mcg total) by mouth daily before breakfast. Additional 1/2 tab on Wednesday     Endocrinology:  Hypothyroid Agents Passed - 02/29/2024 12:50 PM      Passed - TSH in normal range and within 360 days    No results found for: TSH, POCTSH, TSHREFLEX       Passed - Valid encounter within last 12 months    Recent Outpatient Visits           1 month ago Acquired hypothyroidism    [facility] Everlene Parris LABOR, MD

## 2024-03-22 ENCOUNTER — Ambulatory Visit (INDEPENDENT_AMBULATORY_CARE_PROVIDER_SITE_OTHER)

## 2024-03-22 ENCOUNTER — Ambulatory Visit: Payer: Self-pay

## 2024-03-22 VITALS — BP 110/80 | HR 73 | Ht 61.0 in | Wt 109.4 lb

## 2024-03-22 DIAGNOSIS — J029 Acute pharyngitis, unspecified: Secondary | ICD-10-CM | POA: Diagnosis not present

## 2024-03-22 MED ORDER — AMOXICILLIN 500 MG PO CAPS: 500.0000 mg | ORAL_CAPSULE | Freq: Two times a day (BID) | ORAL | 0 refills | Status: AC

## 2024-03-22 NOTE — Progress Notes (Signed)
 Acute Patient Visit  Physician: Buford Gayler A Aniket Paye, MD  Patient: Kathryn Sullivan MRN: [id number] DOB: [birth year] PCP: Gizel Riedlinger A, MD     Subjective:   Chief Complaint  Patient presents with   Acute Visit    Patient states  that she has a sore throat stared yesterday. She isn't feeling well.     HPI: The patient is a 53 y.o. female who presents today for:   Discussed the use of AI scribe software for clinical note transcription with the patient, who gave verbal consent to proceed.  History of Present Illness   Kathryn Sullivan is a 53 year old female who presents with a sore throat and concerns about possible strep throat.  Pharyngitis and oropharyngeal ulceration - Sore throat began 11 days ago, initially unilateral, resolved within 24 hours, then recurred yesterday - Ulcers present on tonsils and uvula since yesterday - No difficulty swallowing - Throat pain improved after taking two Advil last night; pain has not returned today, but ulcers persist - Voice loss occurred during illness, now returning but remains deeper and raspy - Throat feels better today compared to last night - Concerned about possible streptococcal pharyngitis  Constitutional symptoms - Fever and body aches at onset of illness 11 days ago - Fatigue and malaise persisted throughout the week - Chills last night, but home temperature was 98.73F - Increased need for rest, particularly last Thursday, with inability to care for grandchildren due to fatigu  Other items:  - Recent sleep study performed two weeks ago - 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   Pharyngitis, unspecified etiology Yes    No orders of the defined types were placed in this encounter.   Assessment and Plan    Acute pharyngitis with tonsillar  and uvular ulcers Symptoms suggest viral or streptococcal pharyngitis. Strep throat considered due to potential complications. - Perform strep throat swab to rule out streptococcal infection.  F/u to review Elevated lipoprotein A (LPA), lipids and sleep study    - Amoxicillin only to use if fever, worsening pain or severity over the weekend.     OBJECTIVE: Vitals:   03/22/24 1306  BP: 110/80  Pulse: 73  SpO2: 98%  Weight: 109 lb 6.4 oz (49.6 kg)  Height: 5' 1 (1.549 m)    Body mass index is 20.67 kg/m.   Physical Exam Vitals reviewed.  Constitutional:      Appearance: Normal appearance. Well-developed with normal weight.  Cardiovascular:     Rate and Rhythm: Normal rate and regular rhythm. Normal heart sounds. Normal peripheral pulses Pulmonary:     Normal breath sounds with normal effort Skin:    General: Skin is warm and dry without noticeable rash. Neurological:     General: No focal deficit present.  Psychiatric:        Mood and Affect: Mood, behavior and cognition normal    HEENT:  Tonsills and uvular with erythema, no exhudates, no LAD cervical, submandibular, normal TM bilat.    Allergies Patient is allergic to fluconazole.  Past Medical History Patient  has a past medical history of Thyroid disease (2000).  Surgical History Patient  has a past surgical history that includes Cesarean section (01/26/04).  Family History Pateint's family history is not on file. She was  adopted.  Social History Patient  reports that she has never smoked. She has never used smokeless tobacco. She reports that she does not drink alcohol and does not use drugs.    03/22/2024

## 2024-03-22 NOTE — Telephone Encounter (Signed)
 FYI Only or Action Required?: FYI only for provider.  Patient was last seen in primary care on 01/01/2024 by Zafirov, Clarissa A, MD.  Called Nurse Triage reporting Sore Throat.  Symptoms began a week ago.  Interventions attempted: OTC medications: Advil.  Symptoms are: gradually worsening.  Triage Disposition: See Physician Within 24 Hours  Patient/caregiver understands and will follow disposition?: Yes     Copied from CRM #[id number]. Topic: Clinical - Red Word Triage >> Mar 22, 2024  8:17 AM Willma R wrote: [facility] that prompted transfer to Nurse Triage: Patient states she hasn't been feeling well for the last week. States has had a sore throat and this morning there are ulcers on the back. Also has discharge from her eyes. Thinks it may be strep. Reason for Disposition  [1] Pus on tonsils (back of throat) AND [2] fever AND [3] swollen neck lymph nodes (glands)  Answer Assessment - Initial Assessment Questions Patient took Advil for symptoms.   1. ONSET: When did the throat start hurting? (Hours or days ago)      1 week ago  2. SEVERITY: How bad is the sore throat? (Scale 1-10; mild, moderate or severe)     Mild  3. STREP EXPOSURE: Has there been any exposure to strep within the past week? If Yes, ask: What type of contact occurred?      Unsure grand kids have viral symptoms  4.  VIRAL SYMPTOMS: Are there any symptoms of a cold, such as a runny nose, cough, hoarse voice or red eyes?      Loss of voice recently  5. FEVER: Do you have a fever? If Yes, ask: What is your temperature, how was it measured, and when did it start?     Last week Monday had a fever, felt chills last night  6. PUS ON THE TONSILS: Is there pus on the tonsils in the back of your throat?     Sees ulcers in the back of her throat 7. OTHER SYMPTOMS: Do you have any other symptoms? (e.g., difficulty breathing, headache, rash)     Watery eyes and eye discharge  Protocols used: Sore  Throat-A-AH

## 2024-04-25 ENCOUNTER — Other Ambulatory Visit: Payer: Self-pay | Admitting: Gynecology

## 2024-04-25 DIAGNOSIS — Z1231 Encounter for screening mammogram for malignant neoplasm of breast: Secondary | ICD-10-CM

## 2024-05-24 ENCOUNTER — Ambulatory Visit
Admission: RE | Admit: 2024-05-24 | Discharge: 2024-05-24 | Disposition: A | Source: Ambulatory Visit | Attending: Gynecology | Admitting: Gynecology

## 2024-05-24 DIAGNOSIS — Z1231 Encounter for screening mammogram for malignant neoplasm of breast: Secondary | ICD-10-CM
